# Patient Record
Sex: Female | Born: 1980 | Race: Black or African American | Hispanic: No | Smoking: Never smoker
Health system: Southern US, Community
[De-identification: ages and names within clinical notes are randomized; demographics above are authoritative.]

## PROBLEM LIST (undated history)

## (undated) DIAGNOSIS — F32A Depression, unspecified: Secondary | ICD-10-CM

## (undated) DIAGNOSIS — F329 Major depressive disorder, single episode, unspecified: Secondary | ICD-10-CM

## (undated) DIAGNOSIS — R87619 Unspecified abnormal cytological findings in specimens from cervix uteri: Secondary | ICD-10-CM

## (undated) DIAGNOSIS — IMO0002 Reserved for concepts with insufficient information to code with codable children: Secondary | ICD-10-CM

## (undated) HISTORY — PX: BREAST ENHANCEMENT SURGERY: SHX7

## (undated) HISTORY — DX: Reserved for concepts with insufficient information to code with codable children: IMO0002

## (undated) HISTORY — DX: Major depressive disorder, single episode, unspecified: F32.9

## (undated) HISTORY — PX: LAPAROSCOPY: SHX197

## (undated) HISTORY — PX: HYSTEROSCOPY: SHX211

## (undated) HISTORY — DX: Depression, unspecified: F32.A

## (undated) HISTORY — PX: BREAST SURGERY: SHX581

## (undated) HISTORY — PX: MOUTH SURGERY: SHX715

## (undated) HISTORY — DX: Unspecified abnormal cytological findings in specimens from cervix uteri: R87.619

---

## 2001-12-09 HISTORY — PX: AUGMENTATION MAMMAPLASTY: SUR837

## 2003-02-09 ENCOUNTER — Inpatient Hospital Stay (HOSPITAL_COMMUNITY): Admission: AD | Admit: 2003-02-09 | Discharge: 2003-02-09 | Payer: Self-pay | Admitting: Obstetrics and Gynecology

## 2003-11-17 ENCOUNTER — Other Ambulatory Visit: Admission: RE | Admit: 2003-11-17 | Discharge: 2003-11-17 | Payer: Self-pay | Admitting: Obstetrics and Gynecology

## 2005-01-29 ENCOUNTER — Other Ambulatory Visit: Admission: RE | Admit: 2005-01-29 | Discharge: 2005-01-29 | Payer: Self-pay | Admitting: Obstetrics and Gynecology

## 2006-01-27 ENCOUNTER — Other Ambulatory Visit: Admission: RE | Admit: 2006-01-27 | Discharge: 2006-01-27 | Payer: Self-pay | Admitting: Obstetrics & Gynecology

## 2008-03-14 ENCOUNTER — Ambulatory Visit (HOSPITAL_COMMUNITY): Admission: RE | Admit: 2008-03-14 | Discharge: 2008-03-14 | Payer: Self-pay | Admitting: Obstetrics & Gynecology

## 2010-09-20 ENCOUNTER — Other Ambulatory Visit: Admission: RE | Admit: 2010-09-20 | Discharge: 2010-09-20 | Payer: Self-pay | Admitting: Obstetrics and Gynecology

## 2010-12-06 ENCOUNTER — Ambulatory Visit (HOSPITAL_COMMUNITY)
Admission: RE | Admit: 2010-12-06 | Discharge: 2010-12-06 | Payer: Self-pay | Source: Home / Self Care | Attending: Obstetrics and Gynecology | Admitting: Obstetrics and Gynecology

## 2010-12-07 LAB — ABO/RH

## 2010-12-09 NOTE — L&D Delivery Note (Addendum)
Operative Delivery Note At 9:48 PM a viable female was delivered via Vaginal, Vacuum Investment banker, operational).  Presentation: compound (right hand) Position: Right,, Occiput,, Anterior. .  Verbal consent: obtained from patient.  Risks and benefits discussed in detail.  Risks include, but are not limited to the risks of anesthesia, bleeding, infection, damage to maternal tissues, fetal cephalhematoma.  There is also the risk of inability to effect vaginal delivery of the head, or shoulder dystocia that cannot be resolved by established maneuvers, leading to the need for emergency cesarean section.  APGAR: 2, ; weight 7 lb 9.7 oz (3450 g).   Placenta status: Intact, Spontaneous.   Cord: 3 vessels with the following complications: None.  Cord pH: Cord lacerated,not obtained.  Anesthesia: Epidural  Instruments: Bell Vacuum   Episiotomy: none Lacerations: No perineal lacerations, Bilateral Labial lacerations repaired with 2.0 Chromic. No cervical lacerations. Est. Blood Loss (mL): 200 ml    Mom to postpartum.  Baby to NICU.  HR increased after initial resuscitation.  NICU called to bedside.  Intubation with suction for meconium performed.  Infant requiring respiratory support.  Transferred to NICU.  APGAR at 5 minutes pending.  Placenta to pathology.  [name] 07/29/2011, 10:27 PM    Vacuum applied without difficulty.  Two pulls with minimal pressure brought occiput to crowing and vacuum released.  No pop offs.  No nuchal cord. Shoulders delivered easily.

## 2011-01-03 ENCOUNTER — Emergency Department (HOSPITAL_COMMUNITY)
Admission: EM | Admit: 2011-01-03 | Discharge: 2011-01-03 | Disposition: A | Discharge status: Other Acute Inpatient Hospital | Payer: Self-pay | Source: Home / Self Care | Admitting: Emergency Medicine

## 2011-01-03 ENCOUNTER — Inpatient Hospital Stay (HOSPITAL_COMMUNITY)
Admission: EM | Admit: 2011-01-03 | Discharge: 2011-01-07 | Payer: Self-pay | Source: Home / Self Care | Attending: Psychiatry | Admitting: Psychiatry

## 2011-01-03 LAB — URINALYSIS, ROUTINE W REFLEX MICROSCOPIC
Ketones, ur: >80 mg/dL — AB
Nitrite: NEGATIVE
Protein, ur: NEGATIVE mg/dL
pH: 6 (ref 5.0–8.0)

## 2011-01-03 LAB — DIFFERENTIAL
Eosinophils Absolute: 0 10*3/uL (ref 0.0–0.7)
Eosinophils Relative: 0 % (ref 0–5)
Lymphocytes Relative: 23 % (ref 12–46)
Lymphs Abs: 2.3 10*3/uL (ref 0.7–4.0)
Monocytes Relative: 8 % (ref 3–12)

## 2011-01-03 LAB — COMPREHENSIVE METABOLIC PANEL
Albumin: 3.6 g/dL (ref 3.5–5.2)
BUN: 7 mg/dL (ref 6–23)
Calcium: 9.2 mg/dL (ref 8.4–10.5)
Glucose, Bld: 84 mg/dL (ref 70–99)
Total Protein: 7.5 g/dL (ref 6.0–8.3)

## 2011-01-03 LAB — CBC
HCT: 39.6 % (ref 36.0–46.0)
MCH: 32.6 pg (ref 26.0–34.0)
MCV: 94.3 fL (ref 78.0–100.0)
Platelets: 302 10*3/uL (ref 150–400)
RDW: 13 % (ref 11.5–15.5)
WBC: 9.9 10*3/uL (ref 4.0–10.5)

## 2011-01-03 LAB — RAPID URINE DRUG SCREEN, HOSP PERFORMED
Benzodiazepines: NOT DETECTED
Cocaine: NOT DETECTED
Tetrahydrocannabinol: NOT DETECTED

## 2011-01-03 LAB — ETHANOL: Alcohol, Ethyl (B): 7 mg/dL (ref 0–10)

## 2011-01-07 NOTE — H&P (Signed)
  NAMEMARGARITA, [name]         ACCOUNT NO.:  [id number]  MEDICAL RECORD NO.:  [id number]          PATIENT TYPE:  IPS  LOCATION:  0305                          [facility]ACILITY:  [facility]  PHYSICIAN:  Tonya Ditch, MD DATE OF BIRTH:  [birth year]  DATE OF ADMISSION:  01/03/2011 DATE OF DISCHARGE:                      PSYCHIATRIC ADMISSION ASSESSMENT   This is on a 30 year old female voluntarily admitted on January 03, 2011.  HISTORY OF PRESENT ILLNESS:  Patient had presented herself to her OB/GYN telling her that she was feeling very stressed and hopeless, asking for help to cope, was having suicidal thoughts but no specific plan.  She is reporting trouble sleeping and reporting a history of family stress. Patient reports a history of family stress.  Her mother is currently in a nursing home after her stroke of years ago and her brother was involuntarily committed for being suicidal. patient is currently in her first trimester. 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  PAST PSYCHIATRIC HISTORY:  First admission to [facility]. She has a therapist, [name].  SOCIAL HISTORY:  Patient is currently separated.  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  FAMILY HISTORY:  Mother with bipolar disorder.  ALCOHOL AND DRUG HISTORY:  Denies any alcohol or substance use.  PRIMARY CARE PROVIDER:  [facility].  Her doctor is Dr. Gevena Oneal.  MEDICAL PROBLEMS:  Patient is [redacted] weeks pregnant.  MEDICATIONS:  Takes a prenatal vitamin.  DRUG ALLERGIES:  No known allergies.  PHYSICAL EXAM:  This is a healthy-appearing young female assessed in the emergency department.  LABORATORY DATA:  Shows a urine that is negative.  Urine drug screen is negative.  Her SGPT is mildly elevated at 42.  Blood count is within normal limits.  MENTAL STATUS EXAM:  Patient is fully alert and cooperative,  currently dressed in hospital scrubs, good eye contact.  Her speech is articulate. Mood is anxious.  The patient does appear somewhat anxious.  Patient has a lot of questions about her hospitalization but is open to getting help afterwards.  Cognitive function is intact.  Her memory appears intact. His judgment and insight appear to be good.  AXIS I:  Mood disorder, NOS. AXIS II:  Deferred. AXIS III:  Twelve weeks pregnant. AXIS IV:  Psychosocial problems, family stress. AXIS V:  Current is 35.  PLAN:  Continue her prenatal vitamin.  We will offer Tylenol for sleep which was offered by her primary care provider.  We will do a family session with her support group.  Encourage group activity.  Patient would benefit from some outpatient therapy.  TENTATIVE LENGTH OF STAY:  Two to 3 days.     [name], N.P.   ______________________________ Tonya Ditch, MD    JO/MEDQ  D:  01/04/2011  T:  01/04/2011  Job:  [id number]  Electronically Signed by [name]. on 01/07/2011 03:51:35 PM Electronically Signed by [name]  on 01/07/2011 06:24:18 PM

## 2011-01-07 NOTE — Discharge Summary (Signed)
  [name], [name]         ACCOUNT NO.:  [id number]  MEDICAL RECORD NO.:  [id number]          PATIENT TYPE:  IPS  LOCATION:  0305                          [facility]ILITY[facility]  [facility]  PHYSICIAN:  Eulogio Ditch, MD DATE OF BIRTH:  12-08-1981  DATE OF ADMISSION:  01/03/2011 DATE OF DISCHARGE:  01/07/2011                              DISCHARGE SUMMARY   HISTORY OF PRESENT ILLNESS:  [age]-year-old female who was admitted voluntarily.  Patient went to OB/GYN doctor telling that she is feeling very stressed and hopeless, and also she was having suicidal thoughts, but no specific plan.  She also reported trouble with sleeping.  Her mother is currently in nursing home after her stroke of years ago, and her brother was involuntarily committed for being suicidal.  When I saw the patient with a nurse practitioner on Friday, patient denied any suicidal ideations at the time of admission and wanted to be discharged.  I told the patient that she should stay for a few days in the hospital to develop some coping skills to deal with the stress, but as she was pregnant no medication was started.  Patient has no past admission to the [facility].  SOCIAL HISTORY:  Patient is currently separated.  FAMILY HISTORY:  Mother has history of bipolar disorder.  SUBSTANCE ABUSE:  None.  MEDICAL PROBLEMS:  Patient is [redacted] weeks pregnant.  DRUG ALLERGIES:  No known drug allergies.  PHYSICAL EXAM:  Within normal limits.  LABORATORIES:  Within normal limits.  MENTAL STATUS:  On January 07, 2011 I saw the patient.  Patient denied any suicidal or homicidal ideations.  Patient told me that she learned a lot by going to the groups.  She was not having any psychotic or manic symptoms.  Cognition:  Alert, awake, oriented x3.  Memory immediate, recent remote intact.  Funds of knowledge fair.  Insight and judgment intact.  DISCHARGE MEDICATIONS:  None.  Patient was advised to follow with  OB/GYN.  DISCHARGE FOLLOW-UP:  Patient will follow up with Tamala Fothergill, [phone].  Appointment February 2 at 5 p.m.     Eulogio Ditch, MD     SA/MEDQ  D:  01/07/2011  T:  01/07/2011  Job:  [id number]  Electronically Signed by Eulogio Ditch  on 01/07/2011 06:24:54 PM

## 2011-07-25 ENCOUNTER — Telehealth (HOSPITAL_COMMUNITY): Payer: Self-pay | Admitting: *Deleted

## 2011-07-25 ENCOUNTER — Encounter (HOSPITAL_COMMUNITY): Payer: Self-pay | Admitting: *Deleted

## 2011-07-25 NOTE — Telephone Encounter (Signed)
pread screen 

## 2011-07-26 ENCOUNTER — Telehealth (HOSPITAL_COMMUNITY): Payer: Self-pay | Admitting: *Deleted

## 2011-07-26 ENCOUNTER — Encounter (HOSPITAL_COMMUNITY): Payer: Self-pay | Admitting: *Deleted

## 2011-07-26 NOTE — Telephone Encounter (Signed)
EDC applied to event

## 2011-07-29 ENCOUNTER — Inpatient Hospital Stay (HOSPITAL_COMMUNITY)
Admission: AD | Admit: 2011-07-29 | Discharge: 2011-07-31 | DRG: 373 | Disposition: A | Discharge status: Home / Self Care | Payer: BC Managed Care – PPO | Source: Ambulatory Visit | Attending: Obstetrics & Gynecology | Admitting: Obstetrics & Gynecology

## 2011-07-29 ENCOUNTER — Inpatient Hospital Stay (HOSPITAL_COMMUNITY): Payer: BC Managed Care – PPO | Admitting: Anesthesiology

## 2011-07-29 ENCOUNTER — Encounter (HOSPITAL_COMMUNITY): Payer: Self-pay | Admitting: *Deleted

## 2011-07-29 ENCOUNTER — Encounter (HOSPITAL_COMMUNITY): Payer: Self-pay | Admitting: Anesthesiology

## 2011-07-29 DIAGNOSIS — F411 Generalized anxiety disorder: Secondary | ICD-10-CM | POA: Diagnosis not present

## 2011-07-29 DIAGNOSIS — Z22330 Carrier of Group B streptococcus: Secondary | ICD-10-CM

## 2011-07-29 DIAGNOSIS — O99892 Other specified diseases and conditions complicating childbirth: Secondary | ICD-10-CM | POA: Diagnosis present

## 2011-07-29 DIAGNOSIS — O328XX0 Maternal care for other malpresentation of fetus, not applicable or unspecified: Secondary | ICD-10-CM | POA: Diagnosis present

## 2011-07-29 LAB — CBC
MCH: 31.2 pg (ref 26.0–34.0)
MCHC: 33.3 g/dL (ref 30.0–36.0)
MCV: 93.5 fL (ref 78.0–100.0)
Platelets: 265 10*3/uL (ref 150–400)
RBC: 4.14 MIL/uL (ref 3.87–5.11)
RDW: 14.3 % (ref 11.5–15.5)

## 2011-07-29 LAB — ABO/RH: ABO/RH(D): O POS

## 2011-07-29 LAB — RPR: RPR Ser Ql: NONREACTIVE

## 2011-07-29 MED ORDER — OXYCODONE-ACETAMINOPHEN 5-325 MG PO TABS: 2.0000 | ORAL_TABLET | ORAL | Status: DC | PRN

## 2011-07-29 MED ORDER — NALBUPHINE SYRINGE 5 MG/0.5 ML: 5.0000 mg | INJECTION | INTRAMUSCULAR | Status: DC | PRN

## 2011-07-29 MED ORDER — PENICILLIN G POTASSIUM 5000000 UNITS IJ SOLR
2.5000 10*6.[IU] | INTRAVENOUS | Status: DC
  Administered 2011-07-29 (×4): 2.5 10*6.[IU] via INTRAVENOUS
  Filled 2011-07-29 (×6): qty 2.5

## 2011-07-29 MED ORDER — FENTANYL 2.5 MCG/ML BUPIVACAINE 1/10 % EPIDURAL INFUSION (WH - ANES)
INTRAMUSCULAR | Status: DC | PRN
  Administered 2011-07-29: 14 mL/h via EPIDURAL

## 2011-07-29 MED ORDER — LIDOCAINE HCL (PF) 1 % IJ SOLN
30.0000 mL | INTRAMUSCULAR | Status: DC | PRN
  Filled 2011-07-29: qty 30

## 2011-07-29 MED ORDER — IBUPROFEN 600 MG PO TABS: 600.0000 mg | ORAL_TABLET | Freq: Four times a day (QID) | ORAL | Status: DC | PRN

## 2011-07-29 MED ORDER — CITRIC ACID-SODIUM CITRATE 334-500 MG/5ML PO SOLN: 30.0000 mL | ORAL | Status: DC | PRN

## 2011-07-29 MED ORDER — PENICILLIN G POTASSIUM 5000000 UNITS IJ SOLR
5.0000 10*6.[IU] | Freq: Once | INTRAVENOUS | Status: DC
  Administered 2011-07-29: 5 10*6.[IU] via INTRAVENOUS
  Filled 2011-07-29: qty 5

## 2011-07-29 MED ORDER — EPHEDRINE 5 MG/ML INJ: 10.0000 mg | INTRAVENOUS | Status: DC | PRN

## 2011-07-29 MED ORDER — LACTATED RINGERS IV BOLUS (SEPSIS)
500.0000 mL | Freq: Once | INTRAVENOUS | Status: DC
Start: 2011-07-29 — End: 2011-07-29

## 2011-07-29 MED ORDER — ONDANSETRON HCL 4 MG/2ML IJ SOLN
4.0000 mg | Freq: Four times a day (QID) | INTRAMUSCULAR | Status: DC | PRN
  Administered 2011-07-29: 4 mg via INTRAVENOUS
  Filled 2011-07-29: qty 2

## 2011-07-29 MED ORDER — DIPHENHYDRAMINE HCL 50 MG/ML IJ SOLN: 12.5000 mg | INTRAMUSCULAR | Status: DC | PRN

## 2011-07-29 MED ORDER — TERBUTALINE SULFATE 1 MG/ML IJ SOLN: 0.2500 mg | Freq: Once | INTRAMUSCULAR | Status: DC | PRN

## 2011-07-29 MED ORDER — PHENYLEPHRINE 40 MCG/ML (10ML) SYRINGE FOR IV PUSH (FOR BLOOD PRESSURE SUPPORT): 80.0000 ug | PREFILLED_SYRINGE | INTRAVENOUS | Status: DC | PRN

## 2011-07-29 MED ORDER — EPHEDRINE 5 MG/ML INJ
10.0000 mg | INTRAVENOUS | Status: DC | PRN
  Filled 2011-07-29: qty 4

## 2011-07-29 MED ORDER — OXYTOCIN 20 UNITS IN LACTATED RINGERS INFUSION - SIMPLE
1.0000 m[IU]/min | INTRAVENOUS | Status: DC
  Administered 2011-07-29: 2 m[IU]/min via INTRAVENOUS
  Filled 2011-07-29: qty 1000

## 2011-07-29 MED ORDER — ACETAMINOPHEN 325 MG PO TABS: 650.0000 mg | ORAL_TABLET | ORAL | Status: DC | PRN

## 2011-07-29 MED ORDER — FENTANYL 2.5 MCG/ML BUPIVACAINE 1/10 % EPIDURAL INFUSION (WH - ANES)
14.0000 mL/h | INTRAMUSCULAR | Status: DC
  Administered 2011-07-29 (×3): 14 mL/h via EPIDURAL
  Filled 2011-07-29 (×5): qty 60

## 2011-07-29 MED ORDER — FLEET ENEMA 7-19 GM/118ML RE ENEM: 1.0000 | ENEMA | RECTAL | Status: DC | PRN

## 2011-07-29 MED ORDER — OXYTOCIN 20 UNITS IN LACTATED RINGERS INFUSION - SIMPLE: 125.0000 mL/h | INTRAVENOUS | Status: AC

## 2011-07-29 MED ORDER — OXYTOCIN BOLUS FROM INFUSION
500.0000 mL | Freq: Once | INTRAVENOUS | Status: DC
  Filled 2011-07-29: qty 500

## 2011-07-29 MED ORDER — LIDOCAINE HCL 1.5 % IJ SOLN
INTRAMUSCULAR | Status: DC | PRN
  Administered 2011-07-29 (×2): 5 mL via EPIDURAL

## 2011-07-29 MED ORDER — LACTATED RINGERS IV SOLN: 500.0000 mL | INTRAVENOUS | Status: DC | PRN

## 2011-07-29 MED ORDER — LACTATED RINGERS IV SOLN
500.0000 mL | Freq: Once | INTRAVENOUS | Status: AC
  Administered 2011-07-29: 500 mL via INTRAVENOUS

## 2011-07-29 MED ORDER — LACTATED RINGERS IV SOLN
INTRAVENOUS | Status: DC
  Administered 2011-07-29 (×2): via INTRAVENOUS
  Administered 2011-07-29: 125 mL/h via INTRAVENOUS

## 2011-07-29 MED ORDER — PHENYLEPHRINE 40 MCG/ML (10ML) SYRINGE FOR IV PUSH (FOR BLOOD PRESSURE SUPPORT)
80.0000 ug | PREFILLED_SYRINGE | INTRAVENOUS | Status: DC | PRN
  Filled 2011-07-29: qty 5

## 2011-07-29 NOTE — Anesthesia Procedure Notes (Signed)
Epidural Patient location during procedure: OB Start time: 07/29/2011 4:57 AM End time: 07/29/2011 5:05 AM Reason for block: procedure for pain  Staffing Anesthesiologist: Sandrea Hughs Performed by: anesthesiologist   Preanesthetic Checklist Completed: patient identified, site marked, surgical consent, pre-op evaluation, timeout performed, IV checked, risks and benefits discussed and monitors and equipment checked  Epidural Patient position: sitting Prep: site prepped and draped and DuraPrep Patient monitoring: continuous pulse ox and blood pressure Approach: midline Injection technique: LOR air  Needle:  Needle type: Tuohy  Needle gauge: 17 G Needle length: 9 cm Needle insertion depth: 5 cm cm Catheter type: closed end flexible Catheter size: 19 Gauge Catheter at skin depth: 10 cm Test dose: negative and 1.5% lidocaine  Assessment Sensory level: T8 Events: blood not aspirated, injection not painful, no injection resistance, negative IV test and no paresthesia

## 2011-07-29 NOTE — Plan of Care (Signed)
Problem: Consults Goal: Birthing Suites Patient Information Press F2 to bring up selections list   Pt > [redacted] weeks EGA     

## 2011-07-29 NOTE — Progress Notes (Signed)
Dr. Shearon Stalls notified of pt arrival for labor check.  Notified of VE. Notified of reassuring when able to monitor but pt continues to move in bed.  Admit orders received.

## 2011-07-29 NOTE — Anesthesia Preprocedure Evaluation (Addendum)
Anesthesia Evaluation  Name, MR# and DOB Patient awake  General Assessment Comment  Reviewed: Allergy & Precautions, H&P , Patient's Chart, lab work & pertinent test results  Airway Mallampati: II TM Distance: >3 FB Neck ROM: full    Dental No notable dental hx. (+) Teeth Intact   Pulmonary  clear to auscultation  pulmonary exam normalPulmonary Exam Normal breath sounds clear to auscultation none    Cardiovascular - Friction Rub    Neuro/Psych Negative Neurological ROS    GI/Hepatic/Renal negative GI ROS, negative Liver ROS, and negative Renal ROS (+)       Endo/Other  Negative Endocrine ROS (+)      Abdominal Normal abdominal exam  (+)   Musculoskeletal negative musculoskeletal ROS (+)   Hematology negative hematology ROS (+)   Peds  Reproductive/Obstetrics (+) Pregnancy    Anesthesia Other Findings            Anesthesia Physical Anesthesia Plan  ASA: II  Anesthesia Plan: Epidural   Post-op Pain Management:    Induction:   Airway Management Planned:   Additional Equipment:   Intra-op Plan:   Post-operative Plan:   Informed Consent: I have reviewed the patients History and Physical, chart, labs and discussed the procedure including the risks, benefits and alternatives for the proposed anesthesia with the patient or authorized representative who has indicated his/her understanding and acceptance.     Plan Discussed with:   Anesthesia Plan Comments:         Anesthesia Quick Evaluation

## 2011-07-29 NOTE — Progress Notes (Signed)
[name] is a 30 y.o. G1P0 at [redacted]w[redacted]d  Subjective: Pt comfortable.  Feeling pressure.  Objective: BP 116/59  Pulse 94  Temp(Src) 98.5 F (36.9 C) (Oral)  Resp 18  Ht 5\' 5"  (1.651 m)  Wt 78.472 kg (173 lb)  BMI 28.79 kg/m2  LMP 10/17/2010      FHT:  FHR: 130s bpm, variability: minimal ,  accelerations:  Present,  decelerations:  Present early decels Early decels.  UC:   regular, every 2 minutes SVE:   Dilation: 9 Effacement (%): 100 Station: +1 Exam by:: Dr. Idamae Schuller  Labs: Lab Results  Component Value Date   WBC 10.2 07/29/2011   HGB 12.9 07/29/2011   HCT 38.7 07/29/2011   MCV 93.5 07/29/2011   PLT 265 07/29/2011    Assessment / Plan: Augmentation of labor, progressing well  Labor: Progressing normally Preeclampsia:  no signs or symptoms of toxicity Fetal Wellbeing:  Decreased variability but acceleration with fetal scalp stimulation.  O2 per face mask if remains decreased. Pain Control:  Epidural I/D:  PCN for GBS prophalaxis Anticipated MOD:  NSVD  Carie Kapuscinski 07/29/2011, 2:01 PM

## 2011-07-29 NOTE — H&P (Signed)
[name] is a 30 y.o. female presenting for contractions. Maternal Medical History:  Reason for admission: Reason for admission: contractions.  Contractions: Frequency: regular.   Perceived severity is moderate.    Fetal activity: Perceived fetal activity is normal.    Prenatal complications: No substance abuse.   Suicidal ideation after separating from husband.  Prenatal Complications - Diabetes: none. Diabetes is managed by insulin injections.      OB History    Grav Para Term Preterm Abortions TAB SAB Ect Mult Living   1              Past Medical History  Diagnosis Date  . Depression   . Abnormal Pap smear    Past Surgical History  Procedure Date  . Breast surgery   . Hysteroscopy   . Laparoscopy   . Mouth surgery   . Breast enhancement surgery    Family History: family history includes Arthritis in her father; Cancer in her maternal aunt; Depression in her brother and mother; Diabetes in her father; Drug abuse in her brother; Heart disease in her father and sister; Hypertension in her father and sister; Mental illness in her brother and mother; Miscarriages / [place] in her mother; and Stroke in her father and mother. Social History:  reports that she has never smoked. She does not have any smokeless tobacco history on file. She reports that she does not drink alcohol or use illicit drugs.  Review of Systems  Constitutional: Negative for fever.  Respiratory: Negative for shortness of breath.   Cardiovascular: Negative for chest pain and palpitations.  Gastrointestinal: Positive for abdominal pain.    Dilation: 9 Effacement (%): 100 Station: +1 Exam by:: Dr. Idamae Schuller Blood pressure 143/98, pulse 99, temperature 98.3 F (36.8 C), temperature source Oral, resp. rate 18, height 5\' 5"  (1.651 m), weight 78.472 kg (173 lb), last menstrual period 10/17/2010. Maternal Exam:  Uterine Assessment: Contraction frequency is irregular.   Abdomen: Patient  reports no abdominal tenderness. Estimated fetal weight is 7#.   Fetal presentation: vertex  Introitus: Normal vulva. Ferning test: not done.  Nitrazine test: not done. AROM without return of fluid.  Pelvis: adequate for delivery.   Cervix: Cervix evaluated by digital exam.     Physical Exam  Constitutional: She is oriented to person, place, and time. She appears well-developed and well-nourished.  HENT:  Head: Normocephalic and atraumatic.  Eyes: EOM are normal.  Cardiovascular: Normal rate and normal heart sounds.   Respiratory: Effort normal and breath sounds normal.  GI:       Gravid, nontender.  Vertex 7#  Neurological: She is alert and oriented to person, place, and time.  Skin: Skin is warm and dry.    Prenatal labs: ABO, Rh: O/Positive/-- (12/30 0000) Antibody: Negative (12/30 0000) Rubella:  Immune RPR: NON REACTIVE (08/20 0340)  HBsAg:  Neg    HIV:   Neg GBS: Positive (07/19 0000)   Assessment/Plan: Active Labor with epidural.  Protracted labor.  AROM without fluid return. Suspect SROM.  Augmentation with Pitocin. GBS+ PCN until delivery.  S/p 2 doses.   [name] 07/29/2011, 2:07 PM

## 2011-07-29 NOTE — Progress Notes (Signed)
Pt reports contractions x 3 hours, denies bleeding or ROM.  

## 2011-07-29 NOTE — Plan of Care (Signed)
Problem: Consults Goal: Birthing Suites Patient Information Press F2 to bring up selections list  Outcome: Completed/Met Date Met:  07/29/11  Pt > [redacted] weeks EGA

## 2011-07-30 ENCOUNTER — Inpatient Hospital Stay (HOSPITAL_COMMUNITY): Admission: RE | Admit: 2011-07-30 | Payer: Self-pay | Source: Ambulatory Visit

## 2011-07-30 LAB — CBC
MCHC: 33.5 g/dL (ref 30.0–36.0)
Platelets: 309 10*3/uL (ref 150–400)
RDW: 14.7 % (ref 11.5–15.5)
WBC: 26.5 10*3/uL — ABNORMAL HIGH (ref 4.0–10.5)

## 2011-07-30 MED ORDER — PRENATAL PLUS 27-1 MG PO TABS
1.0000 | ORAL_TABLET | Freq: Every day | ORAL | Status: DC
  Administered 2011-07-30 – 2011-07-31 (×3): 1 via ORAL
  Filled 2011-07-30 (×2): qty 1

## 2011-07-30 MED ORDER — DIPHENHYDRAMINE HCL 25 MG PO CAPS: 25.0000 mg | ORAL_CAPSULE | Freq: Four times a day (QID) | ORAL | Status: DC | PRN

## 2011-07-30 MED ORDER — MAGNESIUM HYDROXIDE 400 MG/5ML PO SUSP: 30.0000 mL | ORAL | Status: DC | PRN

## 2011-07-30 MED ORDER — SIMETHICONE 80 MG PO CHEW: 80.0000 mg | CHEWABLE_TABLET | ORAL | Status: DC | PRN

## 2011-07-30 MED ORDER — ZOLPIDEM TARTRATE 5 MG PO TABS: 5.0000 mg | ORAL_TABLET | Freq: Every evening | ORAL | Status: DC | PRN

## 2011-07-30 MED ORDER — BENZOCAINE-MENTHOL 20-0.5 % EX AERO
INHALATION_SPRAY | CUTANEOUS | Status: AC
Start: 2011-07-30 — End: 2011-07-30
  Filled 2011-07-30: qty 56

## 2011-07-30 MED ORDER — LANOLIN HYDROUS EX OINT: TOPICAL_OINTMENT | CUTANEOUS | Status: DC | PRN

## 2011-07-30 MED ORDER — SENNOSIDES-DOCUSATE SODIUM 8.6-50 MG PO TABS
2.0000 | ORAL_TABLET | Freq: Every day | ORAL | Status: DC
  Administered 2011-07-30: 2 via ORAL

## 2011-07-30 MED ORDER — IBUPROFEN 600 MG PO TABS
600.0000 mg | ORAL_TABLET | Freq: Four times a day (QID) | ORAL | Status: DC
  Administered 2011-07-30 – 2011-07-31 (×7): 600 mg via ORAL
  Filled 2011-07-30 (×8): qty 1

## 2011-07-30 MED ORDER — WITCH HAZEL-GLYCERIN EX PADS: 1.0000 "application " | MEDICATED_PAD | CUTANEOUS | Status: DC | PRN

## 2011-07-30 MED ORDER — TETANUS-DIPHTH-ACELL PERTUSSIS 5-2.5-18.5 LF-MCG/0.5 IM SUSP
0.5000 mL | Freq: Once | INTRAMUSCULAR | Status: DC
  Filled 2011-07-30: qty 0.5

## 2011-07-30 MED ORDER — ONDANSETRON HCL 4 MG/2ML IJ SOLN: 4.0000 mg | INTRAMUSCULAR | Status: DC | PRN

## 2011-07-30 MED ORDER — OXYCODONE-ACETAMINOPHEN 5-325 MG PO TABS: 1.0000 | ORAL_TABLET | ORAL | Status: DC | PRN

## 2011-07-30 MED ORDER — DIBUCAINE 1 % RE OINT: 1.0000 "application " | TOPICAL_OINTMENT | RECTAL | Status: DC | PRN

## 2011-07-30 MED ORDER — ONDANSETRON HCL 4 MG PO TABS: 4.0000 mg | ORAL_TABLET | ORAL | Status: DC | PRN

## 2011-07-30 MED ORDER — BENZOCAINE-MENTHOL 20-0.5 % EX AERO
1.0000 "application " | INHALATION_SPRAY | CUTANEOUS | Status: DC | PRN
  Administered 2011-07-30: 1 via TOPICAL

## 2011-07-30 NOTE — Progress Notes (Signed)
UR Chart review completed.  

## 2011-07-30 NOTE — Progress Notes (Signed)
Post Partum Day 1 Subjective: no complaints, up ad lib, voiding, tolerating PO and + flatus  Objective: Blood pressure 115/65, pulse 59, temperature 97.9 F (36.6 C), temperature source Oral, resp. rate 19, height 5\' 5"  (1.651 m), weight 78.472 kg (173 lb), last menstrual period 10/17/2010, SpO2 96.00%.  Physical Exam:  General: alert and cooperative Lochia: appropriate Uterine Fundus: firm Incision: NA DVT Evaluation: No evidence of DVT seen on physical exam.   Basename 07/30/11 0505 07/29/11 0340  HGB 11.7* 12.9  HCT 34.9* 38.7    Assessment/Plan: Plan for discharge tomorrow   LOS: 1 day   [name] J. 07/30/2011, 8:44 AM

## 2011-07-30 NOTE — Progress Notes (Signed)
Spiritual Care - Provided support to parents of NICU baby.  Patient feels confidence in baby's care.  She is a member of [employer] and has support and prayers from the pastor and congregation there.  She is coping well.  Dory Horn, Chaplain

## 2011-07-30 NOTE — Anesthesia Postprocedure Evaluation (Signed)
  Anesthesia Post-op Note  Patient: [name]  Procedure(s) Performed: * No procedures listed *  Patient Location:173  Anesthesia Type: Epidural  Level of Consciousness: awake, alert  and oriented  Airway and Oxygen Therapy: Patient Spontanous Breathing  Post-op Pain: mild  Post-op Assessment: Post-op Vital signs reviewed and Patient's Cardiovascular Status Stable  Post-op Vital Signs: Reviewed and stable  Complications: No apparent anesthesia complications

## 2011-07-31 ENCOUNTER — Encounter (HOSPITAL_COMMUNITY): Payer: Self-pay | Admitting: Obstetrics and Gynecology

## 2011-07-31 DIAGNOSIS — F430 Acute stress reaction: Secondary | ICD-10-CM | POA: Diagnosis not present

## 2011-07-31 DIAGNOSIS — F411 Generalized anxiety disorder: Secondary | ICD-10-CM | POA: Diagnosis not present

## 2011-07-31 MED ORDER — PSYLLIUM 58.6 % PO PACK: 1.0000 | PACK | Freq: Every day | ORAL | Status: AC

## 2011-07-31 MED ORDER — SERTRALINE HCL 25 MG PO TABS: 25.0000 mg | ORAL_TABLET | Freq: Every day | ORAL | Status: AC

## 2011-07-31 MED ORDER — IBUPROFEN 600 MG PO TABS: 600.0000 mg | ORAL_TABLET | Freq: Four times a day (QID) | ORAL | Status: AC

## 2011-07-31 NOTE — Progress Notes (Signed)
Post Partum Day #2 Subjective: Complaints of upper body soreness.  Concerned she has not had a BM.  Suspects hemerohoids.  Baby no longer intubated.  Improving.  Pt is pumping and has been seen by lactation consultant.  Objective: Blood pressure 95/50, pulse 65, temperature 97.7 F (36.5 C), temperature source Oral, resp. rate 18, height 5\' 5"  (1.651 m), weight 78.472 kg (173 lb), last menstrual period 10/17/2010, SpO2 100.00%.  Physical Exam:  General: alert and no distress Pt tearful. Lochia: appropriate Uterine Fundus: firm Incision: N/A   DVT Evaluation: No evidence of DVT seen on physical exam. Trace edema.   Basename 07/30/11 0505 07/29/11 0340  HGB 11.7* 12.9  HCT 34.9* 38.7    Assessment/Plan: Discharge home and Social Work consult H/o psychiatric hospitalization due "mental breakdown."  Discussed at length her social situation and support system.  Has therapist with [facility].  Will contact today to get close f/u.  Social work consult prior to discharge.  Pt agreeable.  May prescribe meds if necessary after consulting with therapist. D/c orders to be done after [facility] consult. Tucks pads and suppositories to bedside.   LOS: 2 days   Ludella Pranger 07/31/2011, 8:46 AM

## 2011-07-31 NOTE — Discharge Summary (Signed)
Obstetric Discharge Summary Reason for Admission: onset of labor Prenatal Procedures: none Intrapartum Procedures: vacuum  IUPC Postpartum Procedures: none Complications-Operative and Postpartum: none  Bilateral lacerations repaired  Hemoglobin  Date Value Range Status  07/30/2011 11.7* 12.0-15.0 (g/dL) Final     HCT  Date Value Range Status  07/30/2011 34.9* 36.0-46.0 (%) Final    Discharge Diagnoses: S/p VAVD, H/o Acute anxiety (High risk for postpartum depression)  Discharge Information: Date: 07/31/2011 Activity: pelvic rest Diet: routine Medications: Ibuprophen Zoloft  Condition: stable Instructions: refer to practice specific booklet and Routine Discharge to: home Follow-up Information    Follow up with COLE,TARA J.. Call in 2 weeks. (Return sooner if anxiety worsens)    Contact information:   [address] [place] [address] [phone]          Newborn Data: Live born female  Birth Weight: 7 lb 9.7 oz (3450 g) APGAR: 2, 5, 7 Suspect meconium aspiration In NICU, extubated, Stable   Ariyana Faw 07/31/2011, 1:03 PM

## 2011-08-05 ENCOUNTER — Inpatient Hospital Stay (HOSPITAL_COMMUNITY): Admission: RE | Admit: 2011-08-05 | Discharge: 2011-08-05 | Payer: BC Managed Care – PPO | Source: Ambulatory Visit

## 2011-08-05 NOTE — Progress Notes (Signed)
Infant Lactation Consultation Outpatient Visit Note  Patient Name: [name] Date of Birth: Mar 18, 1981 Birth Weight:   Gestational Age at Delivery: Gestational Age: <None> Type of Delivery:   Breastfeeding History Frequency of Breastfeeding: q2-3 hrs Length of Feeding: 30-45 min Voids: "Lots" Stools: "Lots" forgot to bring chart  Supplementing / Method: Pumping:  Type of Pump: DEBP   Frequency: 4 times a day  Volume:  1-2 oz  Comments: Mom reports that baby was just at [facility] visit and nursed there. Baby is back to BW- good weight gain. Mom reports that mature milk came in Saturday and baby is nursing well now. No questions at present. Offered assist with feeding but Mom says baby just fed and she is feeling more confident in positioning and with milk supply. Encouraged to call prn and suggested BFSG.        Consultation Evaluation:  Initial Feeding Assessment: Pre-feed Weight: Post-feed Weight: Amount Transferred: Comments:  Additional Feeding Assessment: Pre-feed Weight: Post-feed Weight: Amount Transferred: Comments:  Additional Feeding Assessment: Pre-feed Weight: Post-feed Weight: Amount Transferred: Comments:  Total Breast milk Transferred this Visit:  Total Supplement Given:   Additional Interventions:   Follow-Up      Pamelia Hoit 08/05/2011, 1:04 PM

## 2011-10-22 ENCOUNTER — Other Ambulatory Visit (HOSPITAL_COMMUNITY)
Admission: RE | Admit: 2011-10-22 | Discharge: 2011-10-22 | Disposition: A | Discharge status: Home / Self Care | Payer: BC Managed Care – PPO | Source: Ambulatory Visit | Attending: Obstetrics and Gynecology | Admitting: Obstetrics and Gynecology

## 2011-10-22 ENCOUNTER — Other Ambulatory Visit: Payer: Self-pay | Admitting: Obstetrics and Gynecology

## 2011-10-22 DIAGNOSIS — Z01419 Encounter for gynecological examination (general) (routine) without abnormal findings: Secondary | ICD-10-CM | POA: Insufficient documentation

## 2012-11-16 ENCOUNTER — Other Ambulatory Visit: Payer: Self-pay | Admitting: Obstetrics and Gynecology

## 2012-11-16 ENCOUNTER — Other Ambulatory Visit (HOSPITAL_COMMUNITY)
Admission: RE | Admit: 2012-11-16 | Discharge: 2012-11-16 | Disposition: A | Discharge status: Home / Self Care | Payer: BC Managed Care – PPO | Source: Ambulatory Visit | Attending: Obstetrics and Gynecology | Admitting: Obstetrics and Gynecology

## 2012-11-16 DIAGNOSIS — Z01419 Encounter for gynecological examination (general) (routine) without abnormal findings: Secondary | ICD-10-CM | POA: Insufficient documentation

## 2012-11-16 DIAGNOSIS — Z1151 Encounter for screening for human papillomavirus (HPV): Secondary | ICD-10-CM | POA: Insufficient documentation

## 2013-11-23 ENCOUNTER — Other Ambulatory Visit (HOSPITAL_COMMUNITY)
Admission: RE | Admit: 2013-11-23 | Discharge: 2013-11-23 | Disposition: A | Discharge status: Home / Self Care | Payer: BC Managed Care – PPO | Source: Ambulatory Visit | Attending: Obstetrics and Gynecology | Admitting: Obstetrics and Gynecology

## 2013-11-23 ENCOUNTER — Other Ambulatory Visit: Payer: Self-pay | Admitting: Obstetrics and Gynecology

## 2013-11-23 DIAGNOSIS — Z01419 Encounter for gynecological examination (general) (routine) without abnormal findings: Secondary | ICD-10-CM | POA: Insufficient documentation

## 2014-10-10 ENCOUNTER — Encounter (HOSPITAL_COMMUNITY): Payer: Self-pay | Admitting: Obstetrics and Gynecology

## 2014-11-23 ENCOUNTER — Other Ambulatory Visit (HOSPITAL_COMMUNITY)
Admission: RE | Admit: 2014-11-23 | Discharge: 2014-11-23 | Disposition: A | Discharge status: Home / Self Care | Payer: BC Managed Care – PPO | Source: Ambulatory Visit | Attending: Obstetrics and Gynecology | Admitting: Obstetrics and Gynecology

## 2014-11-23 ENCOUNTER — Other Ambulatory Visit: Payer: Self-pay | Admitting: Obstetrics and Gynecology

## 2014-11-23 DIAGNOSIS — Z01419 Encounter for gynecological examination (general) (routine) without abnormal findings: Secondary | ICD-10-CM | POA: Insufficient documentation

## 2014-11-28 LAB — CYTOLOGY - PAP

## 2015-11-28 ENCOUNTER — Other Ambulatory Visit: Payer: Self-pay | Admitting: Obstetrics and Gynecology

## 2015-11-28 ENCOUNTER — Other Ambulatory Visit (HOSPITAL_COMMUNITY)
Admission: RE | Admit: 2015-11-28 | Discharge: 2015-11-28 | Disposition: A | Discharge status: Home / Self Care | Payer: BLUE CROSS/BLUE SHIELD | Source: Ambulatory Visit | Attending: Obstetrics and Gynecology | Admitting: Obstetrics and Gynecology

## 2015-11-28 DIAGNOSIS — Z01419 Encounter for gynecological examination (general) (routine) without abnormal findings: Secondary | ICD-10-CM | POA: Insufficient documentation

## 2015-11-28 DIAGNOSIS — Z1151 Encounter for screening for human papillomavirus (HPV): Secondary | ICD-10-CM | POA: Insufficient documentation

## 2015-11-30 LAB — CYTOLOGY - PAP

## 2016-10-15 ENCOUNTER — Ambulatory Visit
Admission: RE | Admit: 2016-10-15 | Discharge: 2016-10-15 | Disposition: A | Discharge status: Home / Self Care | Payer: BLUE CROSS/BLUE SHIELD | Source: Ambulatory Visit | Attending: Family Medicine | Admitting: Family Medicine

## 2016-10-15 ENCOUNTER — Other Ambulatory Visit: Payer: Self-pay | Admitting: Family Medicine

## 2016-10-15 DIAGNOSIS — R079 Chest pain, unspecified: Secondary | ICD-10-CM

## 2016-10-15 DIAGNOSIS — R7989 Other specified abnormal findings of blood chemistry: Secondary | ICD-10-CM

## 2016-10-15 DIAGNOSIS — J069 Acute upper respiratory infection, unspecified: Secondary | ICD-10-CM

## 2016-10-15 MED ORDER — IOPAMIDOL (ISOVUE-370) INJECTION 76%
80.0000 mL | Freq: Once | INTRAVENOUS | Status: AC | PRN
  Administered 2016-10-15: 80 mL via INTRAVENOUS

## 2018-12-28 ENCOUNTER — Other Ambulatory Visit (HOSPITAL_COMMUNITY)
Admission: RE | Admit: 2018-12-28 | Discharge: 2018-12-28 | Disposition: A | Discharge status: Home / Self Care | Payer: BLUE CROSS/BLUE SHIELD | Source: Ambulatory Visit | Attending: Obstetrics and Gynecology | Admitting: Obstetrics and Gynecology

## 2018-12-28 ENCOUNTER — Other Ambulatory Visit: Payer: Self-pay | Admitting: Obstetrics and Gynecology

## 2018-12-28 DIAGNOSIS — Z01419 Encounter for gynecological examination (general) (routine) without abnormal findings: Secondary | ICD-10-CM | POA: Insufficient documentation

## 2018-12-31 LAB — CYTOLOGY - PAP
DIAGNOSIS: NEGATIVE
HPV (WINDOPATH): NOT DETECTED

## 2020-10-06 ENCOUNTER — Emergency Department (HOSPITAL_COMMUNITY): Payer: BC Managed Care – PPO

## 2020-10-06 ENCOUNTER — Emergency Department (HOSPITAL_COMMUNITY)
Admission: EM | Admit: 2020-10-06 | Discharge: 2020-10-06 | Disposition: A | Discharge status: Home / Self Care | Payer: BC Managed Care – PPO | Attending: Emergency Medicine | Admitting: Emergency Medicine

## 2020-10-06 ENCOUNTER — Encounter (HOSPITAL_COMMUNITY): Payer: Self-pay | Admitting: Emergency Medicine

## 2020-10-06 ENCOUNTER — Other Ambulatory Visit: Payer: Self-pay

## 2020-10-06 DIAGNOSIS — W108XXA Fall (on) (from) other stairs and steps, initial encounter: Secondary | ICD-10-CM | POA: Insufficient documentation

## 2020-10-06 DIAGNOSIS — S92254A Nondisplaced fracture of navicular [scaphoid] of right foot, initial encounter for closed fracture: Secondary | ICD-10-CM | POA: Insufficient documentation

## 2020-10-06 DIAGNOSIS — W19XXXA Unspecified fall, initial encounter: Secondary | ICD-10-CM

## 2020-10-06 DIAGNOSIS — Y9301 Activity, walking, marching and hiking: Secondary | ICD-10-CM | POA: Insufficient documentation

## 2020-10-06 DIAGNOSIS — S99911A Unspecified injury of right ankle, initial encounter: Secondary | ICD-10-CM | POA: Diagnosis present

## 2020-10-06 DIAGNOSIS — Z79899 Other long term (current) drug therapy: Secondary | ICD-10-CM | POA: Diagnosis not present

## 2020-10-06 MED ORDER — IBUPROFEN 400 MG PO TABS
600.0000 mg | ORAL_TABLET | Freq: Once | ORAL | Status: AC
  Administered 2020-10-06: 600 mg via ORAL
  Filled 2020-10-06: qty 1

## 2020-10-06 MED ORDER — NAPROXEN 375 MG PO TABS: 375.0000 mg | ORAL_TABLET | Freq: Two times a day (BID) | ORAL | 0 refills | Status: AC

## 2020-10-06 MED ORDER — NAPROXEN 375 MG PO TABS: 375.0000 mg | ORAL_TABLET | Freq: Two times a day (BID) | ORAL | 0 refills | Status: DC

## 2020-10-06 NOTE — ED Notes (Signed)
Patient verbalizes understanding of discharge instructions. Opportunity for questioning and answers were provided. Armband removed by staff, pt discharged from ED ambulatory.   

## 2020-10-06 NOTE — ED Triage Notes (Signed)
Pt report falling some steps down and injured her right ankle.

## 2020-10-06 NOTE — ED Provider Notes (Signed)
[facility] EMERGENCY DEPARTMENT Provider Note   CSN: [id number] Arrival date & time: 10/06/20  1515     History Chief Complaint  Patient presents with  . Ankle Pain    [name] is a 39 y.o. female.  HPI   39 year old female with a history of abnormal Pap smear, depression, who presents to the emergency department today for evaluation of right ankle pain.  Patient states she was walking down some steps when she missed the last step.  This caused her to twist her ankle.  She denies falling to the ground, sustaining any head trauma or LOC.  She is mainly complaining of pain to the right ankle/foot.  Has normal sensation.   Past Medical History:  Diagnosis Date  . Abnormal Pap smear   . Depression     Patient Active Problem List   Diagnosis Date Noted  . Anxiety in acute stress reaction 07/31/2011    Past Surgical History:  Procedure Laterality Date  . BREAST ENHANCEMENT SURGERY    . BREAST SURGERY    . HYSTEROSCOPY    . LAPAROSCOPY    . MOUTH SURGERY       OB History    Gravida  1   Para  1   Term  1   Preterm      AB      Living  1     SAB      TAB      Ectopic      Multiple      Live Births  1           Family History  Problem Relation Age of Onset  . Depression Mother   . Mental illness Mother   . Stroke Mother   . Miscarriages / [place] Mother   . Arthritis Father   . Diabetes Father   . Heart disease Father   . Hypertension Father   . Stroke Father   . Heart disease Sister   . Hypertension Sister   . Depression Brother   . Drug abuse Brother   . Mental illness Brother   . Cancer Maternal Aunt     Social History   Tobacco Use  . Smoking status: Never Smoker  Substance Use Topics  . Alcohol use: No  . Drug use: No    Home Medications Prior to Admission medications   Medication Sig Start Date End Date Taking? Authorizing Provider  naproxen (NAPROSYN) 375 MG tablet Take 1 tablet (375  mg total) by mouth 2 (two) times daily for 7 days. 10/06/20 10/13/20  Aislin Onofre S, PA-C  Prenatal Vit-Fe Fumarate-FA (PRENATAL PLUS) 65-1 MG TABS Take 1 tablet by mouth daily.      [provider]  psyllium (METAMUCIL) 58.6 % packet Take 1 packet by mouth daily. 07/31/11   Geryl Rankins, MD  sertraline (ZOLOFT) 25 MG tablet Take 1 tablet (25 mg total) by mouth daily. 07/31/11 07/30/12  Geryl Rankins, MD    Allergies    Patient has no known allergies.  Review of Systems   Review of Systems  Musculoskeletal:       Right ankle pain  Skin: Negative for wound.  Neurological: Negative for weakness and numbness.       No head trauma or LOC    Physical Exam Updated Vital Signs BP 138/74 (BP Location: Right Arm)   Pulse 71   Temp 98.4 F (36.9 C) (Oral)   Resp 18   SpO2  100%   Physical Exam Vitals and nursing note reviewed.  Constitutional:      General: She is not in acute distress.    Appearance: She is well-developed.  HENT:     Head: Normocephalic and atraumatic.  Eyes:     Conjunctiva/sclera: Conjunctivae normal.  Cardiovascular:     Rate and Rhythm: Normal rate.  Pulmonary:     Effort: Pulmonary effort is normal.  Musculoskeletal:        General: Normal range of motion.     Cervical back: Neck supple.     Comments: TTP/swelling/ecchymosis to the hindfoot and right lateral malleolus.  Skin:    General: Skin is warm and dry.  Neurological:     Mental Status: She is alert.     ED Results / Procedures / Treatments   Labs (all labs ordered are listed, but only abnormal results are displayed) Labs Reviewed - No data to display  EKG None  Radiology DG Ankle Complete Right  Result Date: 10/06/2020 CLINICAL DATA:  Pain EXAM: RIGHT ANKLE - COMPLETE 3+ VIEW COMPARISON:  None. FINDINGS: There is a small osseous fragment projecting at the level of the dorsal navicular. There is surrounding soft tissue swelling. There is no dislocation. No radiopaque  foreign body. IMPRESSION: Small osseous fragment projecting at the level of the dorsal navicular may represent a small avulsion fracture. There is surrounding soft tissue swelling. Electronically Signed   By: Katherine Mantle M.D.   On: 10/06/2020 15:56   DG Foot Complete Right  Result Date: 10/06/2020 CLINICAL DATA:  Injured foot.  Lateral foot pain. EXAM: RIGHT FOOT COMPLETE - 3+ VIEW COMPARISON:  None. FINDINGS: The joint spaces are maintained. No acute foot fracture is identified. No ankle fracture. IMPRESSION: No acute bony findings. Electronically Signed   By: Rudie Meyer M.D.   On: 10/06/2020 20:42    Procedures Procedures (including critical care time)  Medications Ordered in ED Medications  ibuprofen (ADVIL) tablet 600 mg (600 mg Oral Given 10/06/20 2019)    ED Course  I have reviewed the triage vital signs and the nursing notes.  Pertinent labs & imaging results that were available during my care of the patient were reviewed by me and considered in my medical decision making (see chart for details).    MDM Rules/Calculators/A&P                          Patient presenting with ankle pain after twisting ankle prior to arrival.  Vital signs stable and patient nontoxic-appearing.  X-ray of right ankle reviewed/interpreted - Small osseous fragment projecting at the level of the dorsal navicular may represent a small avulsion fracture. There is surrounding soft tissue swelling. Xray right foot neg for acute fracture.     A posterior ankle splint was applied and crutches given.  OrthO follow-up given and patient advised to follow-up with either PCP or orthopedics in 1 week for reevaluation.  Advised Tylenol, ibuprofen, and rice protocol for pain.  Advised to return to the ER for any new or worsening symptoms in the meantime.  All questions were answered and patient understands plan and reasons to return.  Final Clinical Impression(s) / ED Diagnoses Final diagnoses:  Closed  nondisplaced fracture of navicular bone of right foot, initial encounter  Fall, initial encounter    Rx / DC Orders ED Discharge Orders         Ordered    naproxen (NAPROSYN) 375 MG tablet  2 times daily        10/06/20 2056           Rayne Du 10/06/20 2112    Arby Barrette, MD 10/08/20 1413

## 2020-10-06 NOTE — Progress Notes (Signed)
Orthopedic Tech Progress Note Patient Details:  [name] 1981-01-01 [id number]  Ortho Devices Type of Ortho Device: Post (short leg) splint, Crutches Ortho Device/Splint Location: RLE Ortho Device/Splint Interventions: Application, Adjustment   Post Interventions Patient Tolerated: Well Instructions Provided: Poper ambulation with device, Care of device   [name] 10/06/2020, 9:23 PM

## 2020-10-06 NOTE — Discharge Instructions (Signed)
Do not bear weight on the foot until you can be evaluated by the orthopedic doctor. You will need to call the office to schedule an appointment for follow up within the next week.   You may alternate taking Tylenol and Naproxen as needed for pain control. You may take Naproxen twice daily as directed on your discharge paperwork and you may take  [phone] mg of Tylenol every 6 hours. Do not exceed 4000 mg of Tylenol daily as this can lead to liver damage. Also, make sure to take Naproxen with meals as it can cause an upset stomach. Do not take other NSAIDs while taking Naproxen such as (Aleve, Ibuprofen, Aspirin, Celebrex, etc) and do not take more than the prescribed dose as this can lead to ulcers and bleeding in your GI tract. You may use warm and cold compresses to help with your symptoms.   Please return to the ER sooner if you have any new or worsening symptoms.

## 2020-12-31 ENCOUNTER — Other Ambulatory Visit: Payer: Self-pay | Admitting: Obstetrics and Gynecology

## 2021-12-31 ENCOUNTER — Other Ambulatory Visit (HOSPITAL_COMMUNITY)
Admission: RE | Admit: 2021-12-31 | Discharge: 2021-12-31 | Disposition: A | Discharge status: Home / Self Care | Payer: BC Managed Care – PPO | Source: Ambulatory Visit | Attending: Obstetrics and Gynecology | Admitting: Obstetrics and Gynecology

## 2021-12-31 DIAGNOSIS — Z01419 Encounter for gynecological examination (general) (routine) without abnormal findings: Secondary | ICD-10-CM | POA: Diagnosis present

## 2022-01-02 LAB — CYTOLOGY - PAP
Comment: NEGATIVE
Diagnosis: NEGATIVE
High risk HPV: NEGATIVE

## 2022-03-26 ENCOUNTER — Other Ambulatory Visit: Payer: Self-pay | Admitting: Obstetrics and Gynecology

## 2022-03-26 DIAGNOSIS — Z1231 Encounter for screening mammogram for malignant neoplasm of breast: Secondary | ICD-10-CM

## 2022-04-10 ENCOUNTER — Ambulatory Visit
Admission: RE | Admit: 2022-04-10 | Discharge: 2022-04-10 | Disposition: A | Discharge status: Home / Self Care | Payer: BC Managed Care – PPO | Source: Ambulatory Visit | Attending: Obstetrics and Gynecology | Admitting: Obstetrics and Gynecology

## 2022-04-10 DIAGNOSIS — Z1231 Encounter for screening mammogram for malignant neoplasm of breast: Secondary | ICD-10-CM

## 2022-04-11 ENCOUNTER — Other Ambulatory Visit: Payer: Self-pay | Admitting: Obstetrics and Gynecology

## 2022-04-11 DIAGNOSIS — R928 Other abnormal and inconclusive findings on diagnostic imaging of breast: Secondary | ICD-10-CM

## 2022-04-24 ENCOUNTER — Other Ambulatory Visit: Payer: Self-pay | Admitting: Obstetrics and Gynecology

## 2022-04-24 ENCOUNTER — Ambulatory Visit
Admission: RE | Admit: 2022-04-24 | Discharge: 2022-04-24 | Disposition: A | Discharge status: Home / Self Care | Payer: BC Managed Care – PPO | Source: Ambulatory Visit | Attending: Obstetrics and Gynecology | Admitting: Obstetrics and Gynecology

## 2022-04-24 DIAGNOSIS — R928 Other abnormal and inconclusive findings on diagnostic imaging of breast: Secondary | ICD-10-CM

## 2023-09-07 IMAGING — MG MM DIGITAL DIAGNOSTIC UNILAT*L* W/ TOMO W/ CAD
4 series · 4 of 12 positions shown · non-contrast
Comparison: 04/10/2022.

CLINICAL DATA: Screening recall for a possible left breast
asymmetry. Screening exam was the baseline study.

EXAM:
DIGITAL DIAGNOSTIC UNILATERAL LEFT MAMMOGRAM WITH TOMOSYNTHESIS AND
CAD; ULTRASOUND LEFT BREAST LIMITED
TECHNIQUE: Left digital diagnostic mammography and breast tomosynthesis was
performed. The images were evaluated with computer-aided detection.;
Targeted ultrasound examination of the left breast was performed.

[L MLO synth-2D]
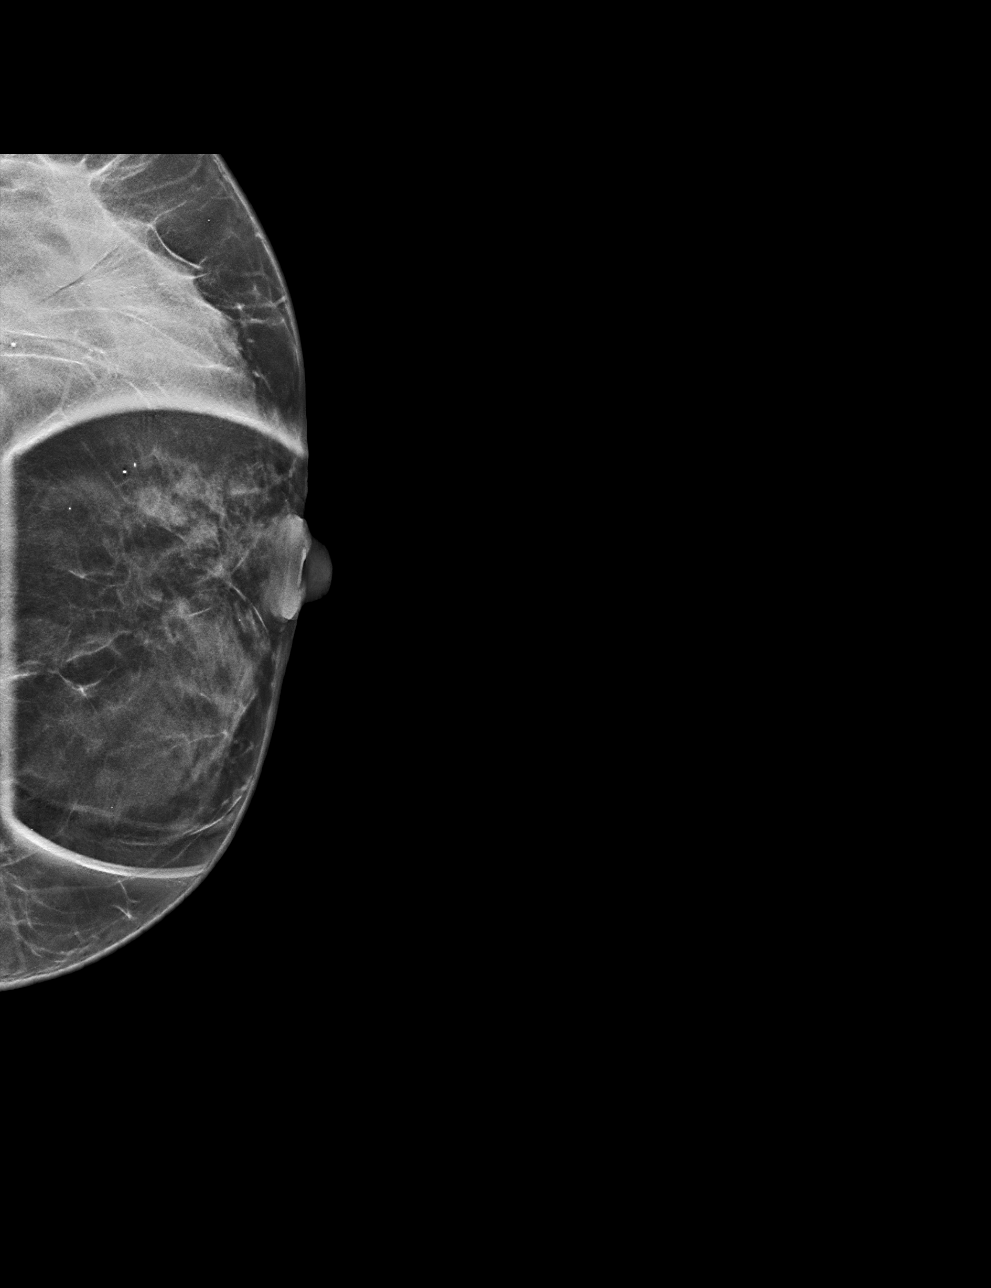

[L CC synth-2D]
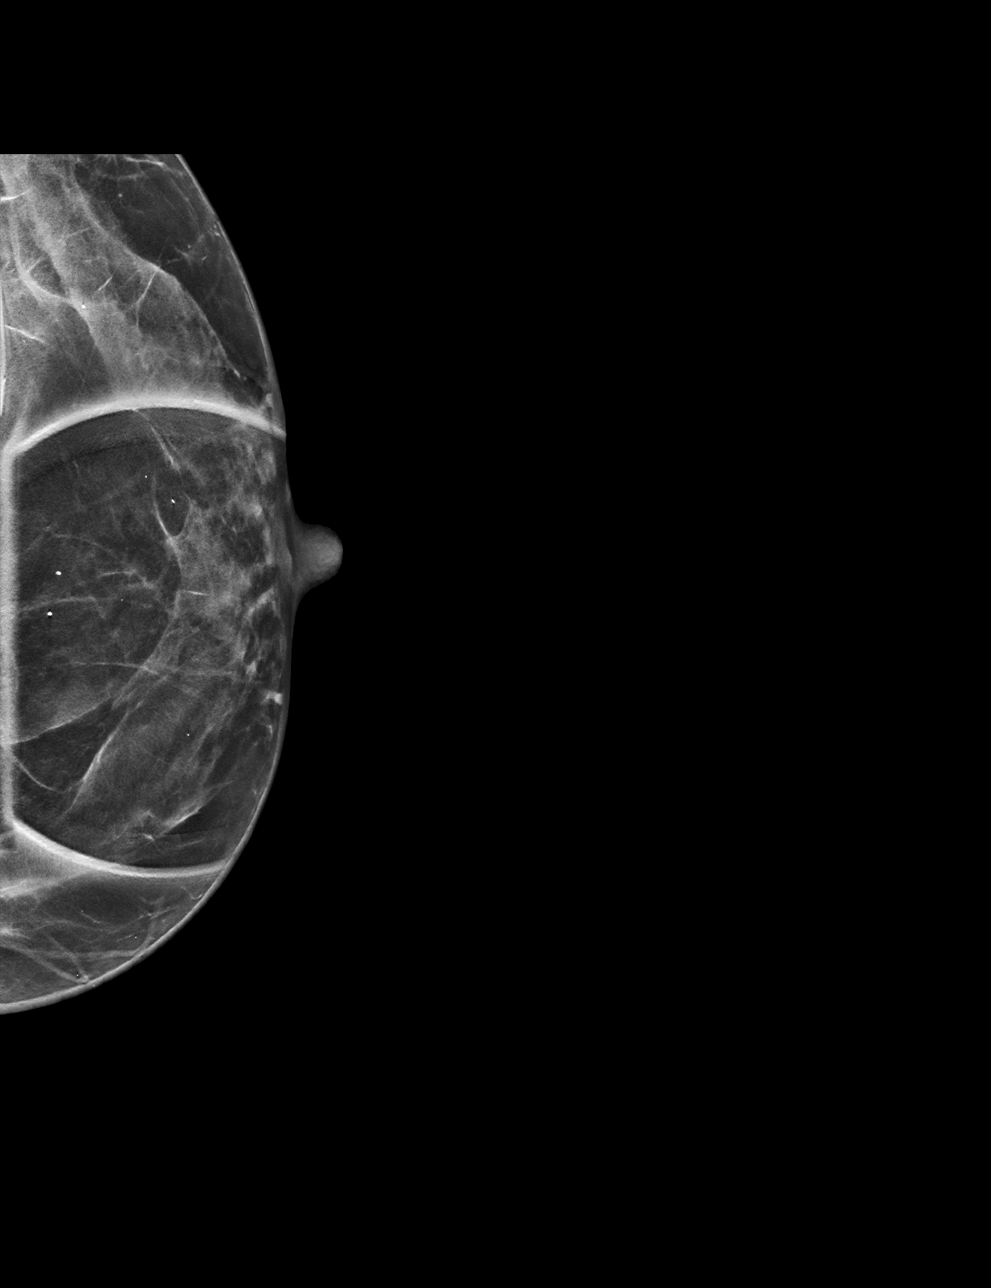

[L CC tomo · tomo slice 21/40.0]
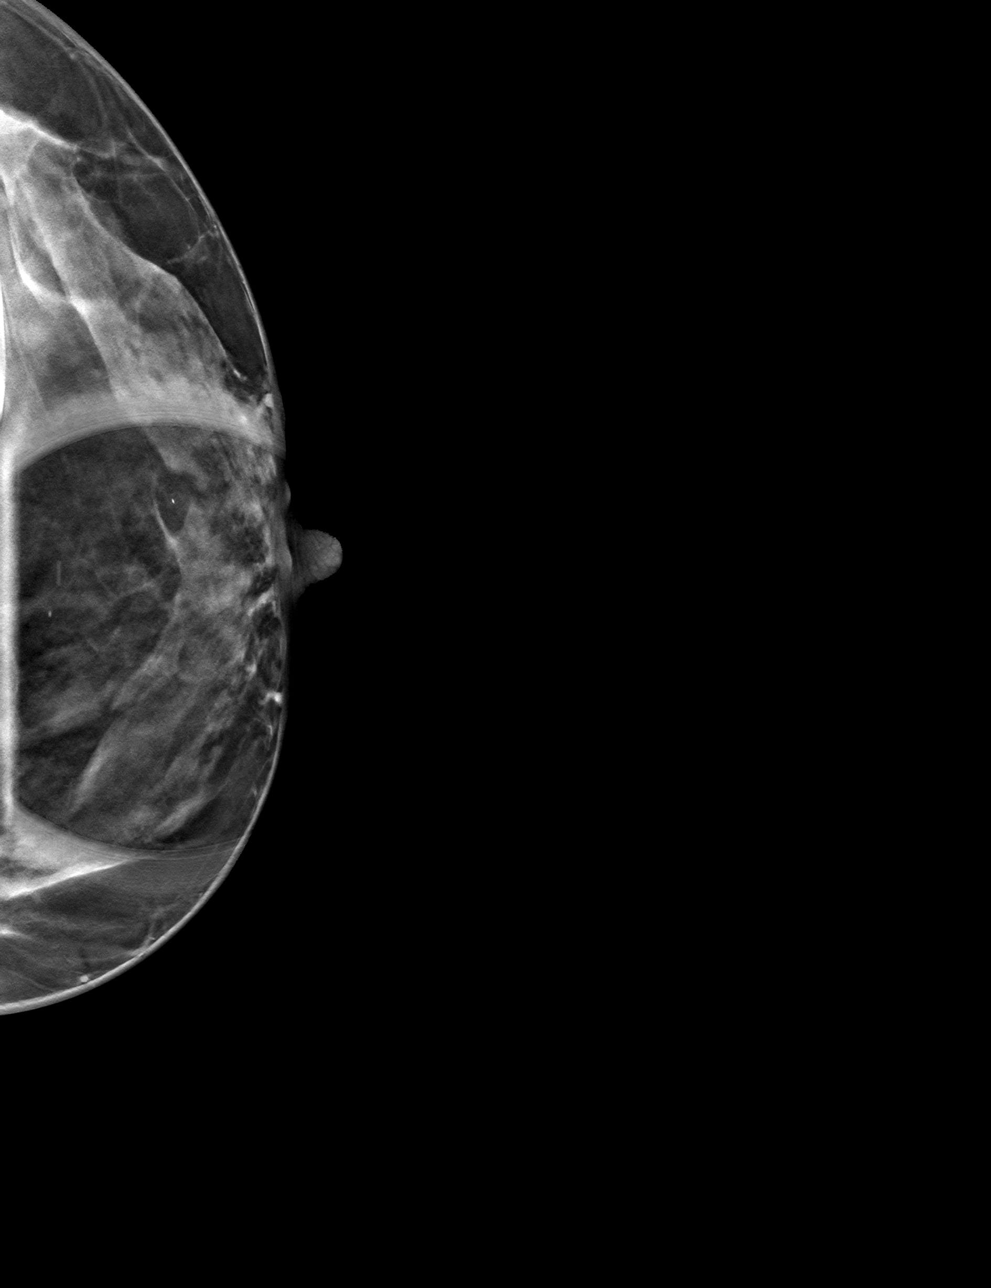

[L MLO tomo · tomo slice 20/39.0]
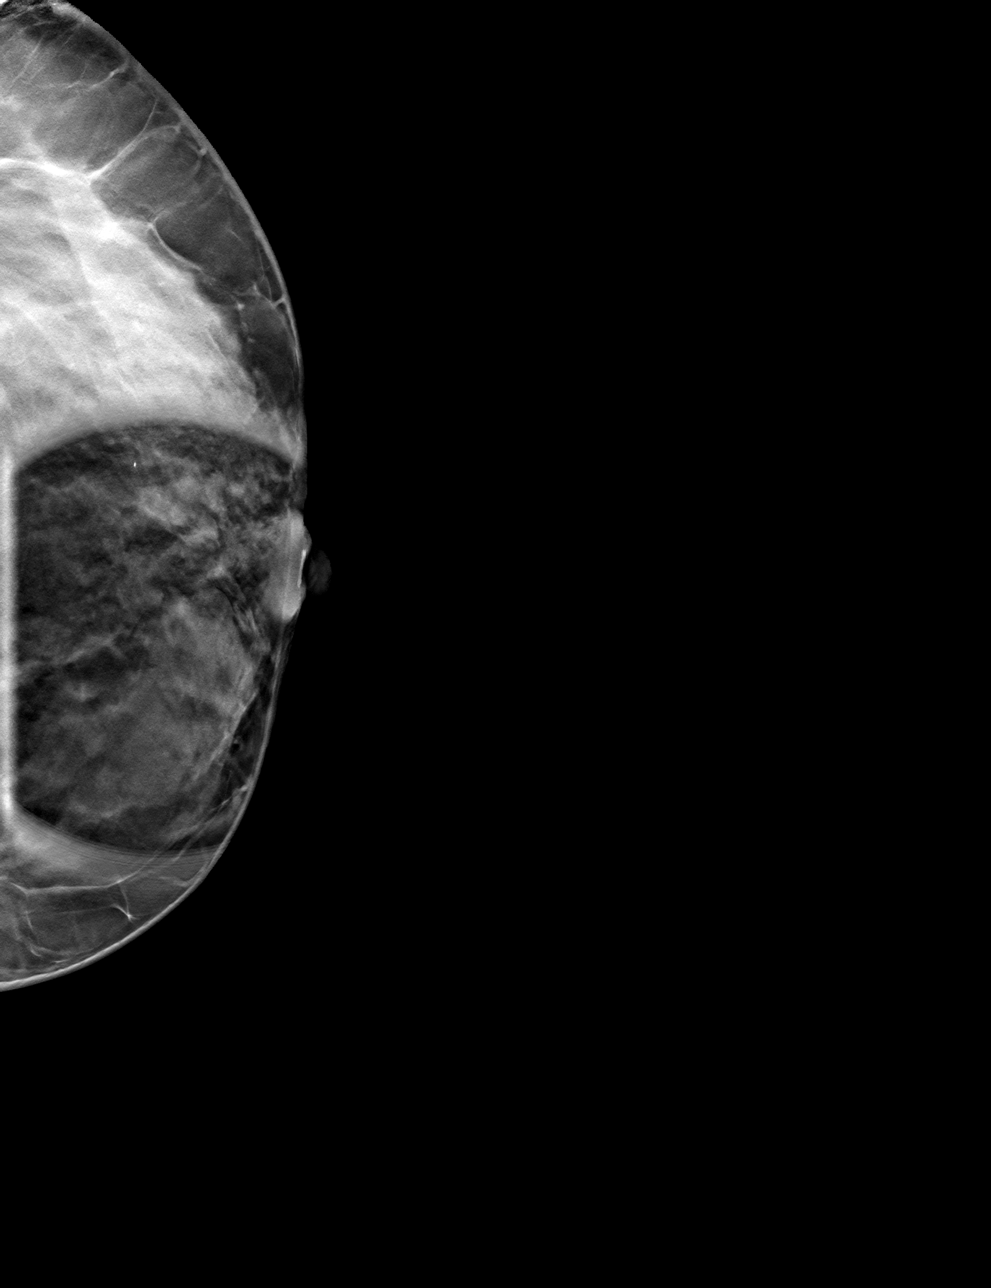

[4 of 12 positions shown; findings below may reference images not displayed]

ACR Breast Density Category c: The breast tissue is heterogeneously
dense, which may obscure small masses.
FINDINGS: On the diagnostic spot-compression images, the area of asymmetry
mostly disperses consistent with normal fibroglandular tissue. No
underlying mass. There is no architectural distortion and there are
no suspicious calcifications. The patient has retropectoral
implants.

On physical exam, no mass is palpated throughout the lower inner and
retroareolar left breast.

Targeted ultrasound is performed, showing normal fibroglandular
tissue the lower inner left breast. No mass or suspicious lesion.
IMPRESSION: 1. No evidence of breast malignancy.
2. Breast asymmetry due to normal fibroglandular tissue.

RECOMMENDATION:
Screening mammogram in one year.(Code:[id number])

I have discussed the findings and recommendations with the patient.
If applicable, a reminder letter will be sent to the patient
regarding the next appointment.

BI-RADS CATEGORY  1: Negative.

## 2024-01-27 ENCOUNTER — Other Ambulatory Visit: Payer: Self-pay | Admitting: Obstetrics and Gynecology

## 2024-01-27 DIAGNOSIS — Z1231 Encounter for screening mammogram for malignant neoplasm of breast: Secondary | ICD-10-CM

## 2024-02-05 ENCOUNTER — Ambulatory Visit
Admission: RE | Admit: 2024-02-05 | Discharge: 2024-02-05 | Disposition: A | Discharge status: Home / Self Care | Payer: No Typology Code available for payment source | Source: Ambulatory Visit | Attending: Obstetrics and Gynecology | Admitting: Obstetrics and Gynecology

## 2024-02-05 DIAGNOSIS — Z1231 Encounter for screening mammogram for malignant neoplasm of breast: Secondary | ICD-10-CM
